# Patient Record
Sex: Female | Born: 1995 | Race: Black or African American | Hispanic: No | Marital: Single | Smoking: Never smoker
Health system: Southern US, Community
[De-identification: ages and names within clinical notes are randomized; demographics above are authoritative.]

## PROBLEM LIST (undated history)

## (undated) DIAGNOSIS — L732 Hidradenitis suppurativa: Secondary | ICD-10-CM

## (undated) DIAGNOSIS — L309 Dermatitis, unspecified: Secondary | ICD-10-CM

## (undated) DIAGNOSIS — D649 Anemia, unspecified: Secondary | ICD-10-CM

## (undated) HISTORY — DX: Anemia, unspecified: D64.9

## (undated) HISTORY — DX: Dermatitis, unspecified: L30.9

## (undated) HISTORY — DX: Hidradenitis suppurativa: L73.2

---
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
## 2016-02-19 DIAGNOSIS — Z79899 Other long term (current) drug therapy: Secondary | ICD-10-CM | POA: Diagnosis not present

## 2016-02-19 DIAGNOSIS — L700 Acne vulgaris: Secondary | ICD-10-CM | POA: Diagnosis not present

## 2016-02-19 DIAGNOSIS — L732 Hidradenitis suppurativa: Secondary | ICD-10-CM | POA: Diagnosis not present

## 2016-03-23 DIAGNOSIS — L700 Acne vulgaris: Secondary | ICD-10-CM | POA: Diagnosis not present

## 2016-03-23 DIAGNOSIS — Z79899 Other long term (current) drug therapy: Secondary | ICD-10-CM | POA: Diagnosis not present

## 2016-03-23 DIAGNOSIS — L732 Hidradenitis suppurativa: Secondary | ICD-10-CM | POA: Diagnosis not present

## 2016-04-20 DIAGNOSIS — L700 Acne vulgaris: Secondary | ICD-10-CM | POA: Diagnosis not present

## 2016-04-20 DIAGNOSIS — Z79899 Other long term (current) drug therapy: Secondary | ICD-10-CM | POA: Diagnosis not present

## 2016-04-20 DIAGNOSIS — L732 Hidradenitis suppurativa: Secondary | ICD-10-CM | POA: Diagnosis not present

## 2016-05-18 DIAGNOSIS — L732 Hidradenitis suppurativa: Secondary | ICD-10-CM | POA: Diagnosis not present

## 2016-05-18 DIAGNOSIS — L700 Acne vulgaris: Secondary | ICD-10-CM | POA: Diagnosis not present

## 2016-05-18 DIAGNOSIS — Z79899 Other long term (current) drug therapy: Secondary | ICD-10-CM | POA: Diagnosis not present

## 2017-05-24 ENCOUNTER — Ambulatory Visit (INDEPENDENT_AMBULATORY_CARE_PROVIDER_SITE_OTHER): Payer: 59 | Admitting: Family Medicine

## 2017-05-24 ENCOUNTER — Other Ambulatory Visit: Payer: Self-pay

## 2017-05-24 ENCOUNTER — Encounter: Payer: Self-pay | Admitting: Family Medicine

## 2017-05-24 VITALS — BP 122/81 | HR 71 | Temp 98.6°F | Resp 17 | Ht 64.25 in | Wt 203.8 lb

## 2017-05-24 DIAGNOSIS — E6609 Other obesity due to excess calories: Secondary | ICD-10-CM | POA: Diagnosis not present

## 2017-05-24 DIAGNOSIS — Z6834 Body mass index (BMI) 34.0-34.9, adult: Secondary | ICD-10-CM

## 2017-05-24 DIAGNOSIS — R062 Wheezing: Secondary | ICD-10-CM | POA: Diagnosis not present

## 2017-05-24 DIAGNOSIS — Z13220 Encounter for screening for lipoid disorders: Secondary | ICD-10-CM | POA: Diagnosis not present

## 2017-05-24 DIAGNOSIS — J4520 Mild intermittent asthma, uncomplicated: Secondary | ICD-10-CM | POA: Diagnosis not present

## 2017-05-24 DIAGNOSIS — Z131 Encounter for screening for diabetes mellitus: Secondary | ICD-10-CM | POA: Diagnosis not present

## 2017-05-24 DIAGNOSIS — Z0000 Encounter for general adult medical examination without abnormal findings: Secondary | ICD-10-CM | POA: Diagnosis not present

## 2017-05-24 MED ORDER — ALBUTEROL SULFATE HFA 108 (90 BASE) MCG/ACT IN AERS: 2.0000 | INHALATION_SPRAY | RESPIRATORY_TRACT | 1 refills | Status: AC | PRN

## 2017-05-24 NOTE — Patient Instructions (Addendum)
   IF you received an x-ray today, you will receive an invoice from [facility]. Please contact [facility] at [phone] with questions or concerns regarding your invoice.   IF you received labwork today, you will receive an invoice from [facility]. Please contact [facility] at [phone] with questions or concerns regarding your invoice.   Our billing staff will not be able to assist you with questions regarding bills from these companies.  You will be contacted with the lab results as soon as they are available. The fastest way to get your results is to activate your My Chart account. Instructions are located on the last page of this paperwork. If you have not heard from us regarding the results in 2 weeks, please contact this office.      Asthma Attack Prevention, Adult Although you may not be able to control the fact that you have asthma, you can take actions to prevent episodes of asthma (asthma attacks). These actions include:  Creating a written plan for managing and treating your asthma attacks (asthma action plan).  Monitoring your asthma.  Avoiding things that can irritate your airways or make your asthma symptoms worse (asthma triggers).  Taking your medicines as directed.  Acting quickly if you have signs or symptoms of an asthma attack.  What are some ways to prevent an asthma attack? Create a plan Work with your health care provider to create an asthma action plan. This plan should include:  A list of your asthma triggers and how to avoid them.  A list of symptoms that you experience during an asthma attack.  Information about when to take medicine and how much medicine to take.  Information to help you understand your peak flow measurements.  Contact information for your health care providers.  Daily actions that you can take to control asthma.  Monitor your asthma  To monitor your asthma:  Use your peak flow meter every morning and  every evening for 2-3 weeks. Record the results in a journal. A drop in your peak flow numbers on one or more days may mean that you are starting to have an asthma attack, even if you are not having symptoms.  When you have asthma symptoms, write them down in a journal.  Avoid asthma triggers  Work with your health care provider to find out what your asthma triggers are. This can be done by:  Being tested for allergies.  Keeping a journal that notes when asthma attacks occur and what may have contributed to them.  Asking your health care provider whether other medical conditions make your asthma worse.  Common asthma triggers include:  Dust.  Smoke. This includes campfire smoke and secondhand smoke from tobacco products.  Pet dander.  Trees, grasses or pollens.  Very cold, dry, or humid air.  Mold.  Foods that contain high amounts of sulfites.  Strong smells.  Engine exhaust and air pollution.  Aerosol sprays and fumes from household cleaners.  Household pests and their droppings, including dust mites and cockroaches.  Certain medicines, including NSAIDs.  Once you have determined your asthma triggers, take steps to avoid them. Depending on your triggers, you may be able to reduce the chance of an asthma attack by:  Keeping your home clean. Have someone dust and vacuum your home for you 1 or 2 times a week. If possible, have them use a high-efficiency particulate arrestance (HEPA) vacuum.  Washing your sheets weekly in hot water.  Using allergy-proof mattress covers and   casings on your bed.  Keeping pets out of your home.  Taking care of mold and water problems in your home.  Avoiding areas where people smoke.  Avoiding using strong perfumes or odor sprays.  Avoid spending a lot of time outdoors when pollen counts are high and on very windy days.  Talking with your health care provider before stopping or starting any new medicines.  Medicines Take  over-the-counter and prescription medicines only as told by your health care provider. Many asthma attacks can be prevented by carefully following your medicine schedule. Taking your medicines correctly is especially important when you cannot avoid certain asthma triggers. Even if you are doing well, do not stop taking your medicine and do not take less medicine. Act quickly If an asthma attack happens, acting quickly can decrease how severe it is and how long it lasts. Take these actions:  Pay attention to your symptoms. If you are coughing, wheezing, or having difficulty breathing, do not wait to see if your symptoms go away on their own. Follow your asthma action plan.  If you have followed your asthma action plan and your symptoms are not improving, call your health care provider or seek immediate medical care at the nearest hospital.  It is important to write down how often you need to use your fast-acting rescue inhaler. You can track how often you use an inhaler in your journal. If you are using your rescue inhaler more often, it may mean that your asthma is not under control. Adjusting your asthma treatment plan may help you to prevent future asthma attacks and help you to gain better control of your condition. How can I prevent an asthma attack when I exercise?  Exercise is a common asthma trigger. To prevent asthma attacks during exercise:  Follow advice from your health care provider about whether you should use your fast-acting inhaler before exercising. Many people with asthma experience exercise-induced bronchoconstriction (EIB). This condition often worsens during vigorous exercise in cold, humid, or dry environments. Usually, people with EIB can stay very active by using a fast-acting inhaler before exercising.  Avoid exercising outdoors in very cold or humid weather.  Avoid exercising outdoors when pollen counts are high.  Warm up and cool down when exercising.  Stop exercising  right away if asthma symptoms start.  Consider taking part in exercises that are less likely to cause asthma symptoms such as:  Indoor swimming.  Biking.  Walking.  Hiking.  Playing football.  This information is not intended to replace advice given to you by your health care provider. Make sure you discuss any questions you have with your health care provider. Document Released: 01/19/2009 Document Revised: 10/02/2015 Document Reviewed: 07/18/2015 Elsevier Interactive Patient Education  2018 [employer] Inc[employer]  

## 2017-05-24 NOTE — Progress Notes (Signed)
Chief Complaint  Patient presents with  . Annual Exam    no pap    Subjective:  Diana Blankenship is a 22 y.o. female here for a health maintenance visit.  Patient is established pt  Pt needs refill of albuterol inhaler Since the heavy pollen she has been coughing, wheezing and chest tightness She reports that she has been out of her inhaler for a few months She states that her last asthma flare was 2017 Her triggers are pollen and strong fragrances She takes an over the counter antihistamine but does not know which one she is taking    Patient Active Problem List   Diagnosis Date Noted  . Extrinsic asthma 04/14/2015  . Hidradenitis suppurativa 08/04/2012    Past Medical History:  Diagnosis Date  . Anemia   . Asthma   . Eczema   . Hidradenitis     No past surgical history on file.   Outpatient Medications Prior to Visit  Medication Sig Dispense Refill  . albuterol (PROVENTIL HFA;VENTOLIN HFA) 108 (90 Base) MCG/ACT inhaler Inhale 2 puffs into the lungs every 4 (four) hours as needed for wheezing or shortness of breath (cough, shortness of breath or wheezing.). 1 Inhaler 3  . azithromycin (ZITHROMAX) 250 MG tablet Take 2 tabs PO x 1 dose, then 1 tab PO QD x 4 days 6 tablet 0  . doxycycline (VIBRAMYCIN) 100 MG capsule Take 1 capsule (100 mg total) by mouth 2 (two) times daily. (Patient not taking: Reported on 04/14/2015) 60 capsule 0  . prednisoLONE (ORAPRED ODT) 10 MG disintegrating tablet Take 10 mg by mouth daily. Reported on 04/14/2015     No facility-administered medications prior to visit.     No Known Allergies   Family History  Problem Relation Age of Onset  . Diabetes Mother   . Hyperlipidemia Mother   . Hypertension Father      Health Habits: Dental Exam: up to date Eye Exam: up to date   Social History   Socioeconomic History  . Marital status: Single    Spouse name: Not on file  . Number of children: Not on file  . Years of education: Not on  file  . Highest education level: Not on file  Occupational History  . Not on file  Social Needs  . Financial resource strain: Not on file  . Food insecurity:    Worry: Not on file    Inability: Not on file  . Transportation needs:    Medical: Not on file    Non-medical: Not on file  Tobacco Use  . Smoking status: Never Smoker  . Smokeless tobacco: Never Used  Substance and Sexual Activity  . Alcohol use: No  . Drug use: No  . Sexual activity: Not on file  Lifestyle  . Physical activity:    Days per week: Not on file    Minutes per session: Not on file  . Stress: Not on file  Relationships  . Social connections:    Talks on phone: Not on file    Gets together: Not on file    Attends religious service: Not on file    Active member of club or organization: Not on file    Attends meetings of clubs or organizations: Not on file    Relationship status: Not on file  . Intimate partner violence:    Fear of current or ex partner: Not on file    Emotionally abused: Not on file    Physically  abused: Not on file    Forced sexual activity: Not on file  Other Topics Concern  . Not on file  Social History Narrative  . Not on file   Social History   Substance and Sexual Activity  Alcohol Use No   Social History   Tobacco Use  Smoking Status Never Smoker  Smokeless Tobacco Never Used   Social History   Substance and Sexual Activity  Drug Use No    GYN: Sexual Health Menstrual status: regular menses LMP: Patient's last menstrual period was 05/22/2017. Last pap smear: see HM section History of abnormal pap smears:  Sexually active: never Current contraception: none  Health Maintenance: See under health Maintenance activity for review of completion dates as well.  There is no immunization history on file for this patient.    Depression Screen-PHQ2/9 Depression screen [facility] 2/9 05/24/2017 04/14/2015 01/18/2015  Decreased Interest 0 0 0  Down, Depressed, Hopeless 0 0  0  PHQ - 2 Score 0 0 0    Depression Severity and Treatment Recommendations:  0-4= None  5-9= Mild / Treatment: Support, educate to call if worse; return in one month  10-14= Moderate / Treatment: Support, watchful waiting; Antidepressant or Psycotherapy  15-19= Moderately severe / Treatment: Antidepressant OR Psychotherapy  >= 20 = Major depression, severe / Antidepressant AND Psychotherapy    Review of Systems   Review of Systems  Constitutional: Negative for chills and fever.  HENT: Positive for congestion and sinus pain.   Respiratory: Positive for cough, shortness of breath and wheezing. Negative for hemoptysis and sputum production.   Cardiovascular: Negative for chest pain and palpitations.  Gastrointestinal: Negative for abdominal pain, nausea and vomiting.  Genitourinary: Negative for dysuria, frequency and urgency.     See HPI for ROS as well.    Objective:   Vitals:   05/24/17 1413  BP: 122/81  Pulse: 71  Resp: 17  Temp: 98.6 F (37 C)  TempSrc: Oral  SpO2: 95%  Weight: 203 lb 12.8 oz (92.4 kg)  Height: 5' 4.25" (1.632 m)    Body mass index is 34.71 kg/m.  Physical Exam  BP 122/81 (BP Location: Left Arm, Patient Position: Sitting, Cuff Size: Large)   Pulse 71   Temp 98.6 F (37 C) (Oral)   Resp 17   Ht 5' 4.25" (1.632 m)   Wt 203 lb 12.8 oz (92.4 kg)   LMP 05/22/2017   SpO2 95%   BMI 34.71 kg/m   General Appearance:    Alert, cooperative, no distress, appears stated age  Head:    Normocephalic, without obvious abnormality, atraumatic  Eyes:    PERRL, conjunctiva/corneas clear, EOM's intact, fundi    benign, both eyes  Ears:    Normal TM's and external ear canals, both ears  Nose:   Nares normal, septum midline, mucosa normal, no drainage    or sinus tenderness  Throat:   Lips, mucosa, and tongue normal; teeth and gums normal  Neck:   Supple, symmetrical, trachea midline, no adenopathy;    thyroid:  no enlargement/tenderness/nodules; no  carotid   bruit or JVD  Back:     Symmetric, no curvature, ROM normal, no CVA tenderness Breast exam: chaperone present, scars from previous procedures for lancing of boils and hydradenitis  Scar tissues noted in the axillary areas bilaterally  Lungs:     Clear to auscultation bilaterally, respirations unlabored  Chest Wall:    No tenderness or deformity   Heart:    Regular  rate and rhythm, S1 and S2 normal, no murmur, rub   or gallop  Abdomen:     Soft, non-tender, bowel sounds active all four quadrants,    no masses, no organomegaly  Extremities:   Extremities normal, atraumatic, no cyanosis or edema  Pulses:   2+ and symmetric all extremities  Skin:   Skin color, texture, turgor normal, no rashes or lesions  Lymph nodes:   Cervical, supraclavicular, and axillary nodes normal  Neurologic:   CNII-XII intact, normal strength, sensation and reflexes    throughout      Assessment/Plan:   Patient was seen for a health maintenance exam.  Counseled the patient on health maintenance issues. Reviewed her health mainteance schedule and ordered appropriate tests (see orders.) Counseled on regular exercise and weight management. Recommend regular eye exams and dental cleaning.   The following issues were addressed today for health maintenance:   Tykisha was seen today for annual exam.  Diagnoses and all orders for this visit:  Encounter for health maintenance examination in adult- age appropriate screenings reviewed  Pap to be done by Gynecology  Mild intermittent asthma without complication- spirometry ordered and reviewed -     Care order/instruction  Screening for diabetes mellitus -     Hemoglobin A1c  Screening, lipid -     Lipid panel  Class 1 obesity due to excess calories without serious comorbidity with body mass index (BMI) of 34.0 to 34.9 in adult- discussed exercise and weight loss  Extrinsic asthma, mild intermittent, uncomplicated- refilled asthma medications -      albuterol (PROVENTIL HFA;VENTOLIN HFA) 108 (90 Base) MCG/ACT inhaler; Inhale 2 puffs into the lungs every 4 (four) hours as needed for wheezing or shortness of breath (cough, shortness of breath or wheezing.).  Wheezing -     albuterol (PROVENTIL HFA;VENTOLIN HFA) 108 (90 Base) MCG/ACT inhaler; Inhale 2 puffs into the lungs every 4 (four) hours as needed for wheezing or shortness of breath (cough, shortness of breath or wheezing.).    No follow-ups on file.    Body mass index is 34.71 kg/m.:  Discussed the patient's BMI with patient. The BMI body mass index is 34.71 kg/m.     No future appointments.  Patient Instructions       IF you received an x-ray today, [facility]. Please [facility] at [phone] with questions or concerns regarding your invoice.   IF you received labwork today, you will receive an invoice from [facility]. Please contact [facility] at [phone] with questions or concerns regarding your invoice.   Our billing staff will not be able to assist you with questions regarding bills from these companies.  You will be contacted with the lab results as soon as they are available. The fastest way to get your results is to activate your My Chart account. Instructions are located on the last page of this paperwork. If you have not heard from us regarding the results in 2 weeks, please contact this office.            Asthma Attack Prevention, Adult Although you may not be able to control the fact that you have asthma, you can take actions to prevent episodes of asthma (asthma attacks). These actions include:  Creating a written plan for managing and treating your asthma attacks (asthma action plan).  Monitoring your asthma.  Avoiding things that can irritate your airways or make your asthma symptoms worse (asthma triggers).  Taking your  medicines as directed.  Acting quickly if you have signs or  symptoms of an asthma attack.  What are some ways to prevent an asthma attack? Create a plan Work with your health care provider to create an asthma action plan. This plan should include:  A list of your asthma triggers and how to avoid them.  A list of symptoms that you experience during an asthma attack.  Information about when to take medicine and how much medicine to take.  Information to help you understand your peak flow measurements.  Contact information for your health care providers.  Daily actions that you can take to control asthma.  Monitor your asthma  To monitor your asthma:  Use your peak flow meter every morning and every evening for 2-3 weeks. Record the results in a journal. A drop in your peak flow numbers on one or more days may mean that you are starting to have an asthma attack, even if you are not having symptoms.  When you have asthma symptoms, write them down in a journal.  Avoid asthma triggers  Work with your health care provider to find out what your asthma triggers are. This can be done by:  Being tested for allergies.  Keeping a journal that notes when asthma attacks occur and what may have contributed to them.  Asking your health care provider whether other medical conditions make your asthma worse.  Common asthma triggers include:  Dust.  Smoke. This includes campfire smoke and secondhand smoke from tobacco products.  Pet dander.  Trees, grasses or pollens.  Very cold, dry, or humid air.  Mold.  Foods that contain high amounts of sulfites.  Strong smells.  Engine exhaust and air pollution.  Aerosol sprays and fumes from household cleaners.  Household pests and their droppings, including dust mites and cockroaches.  Certain medicines, including NSAIDs.  Once you have determined your asthma triggers, take steps to avoid them. Depending on your triggers, you may be able to reduce the chance of an asthma attack by:  Keeping  your home clean. Have someone dust and vacuum your home for you 1 or 2 times a week. If possible, have them use a high-efficiency particulate arrestance (HEPA) vacuum.  Washing your sheets weekly in hot water.  Using allergy-proof mattress covers and casings on your bed.  Keeping pets out of your home.  Taking care of mold and water problems in your home.  Avoiding areas where people smoke.  Avoiding using strong perfumes or odor sprays.  Avoid spending a lot of time outdoors when pollen counts are high and on very windy days.  Talking with your health care provider before stopping or starting any new medicines.  Medicines Take over-the-counter and prescription medicines only as told by your health care provider. Many asthma attacks can be prevented by carefully following your medicine schedule. Taking your medicines correctly is especially important when you cannot avoid certain asthma triggers. Even if you are doing well, do not stop taking your medicine and do not take less medicine. Act quickly If an asthma attack happens, acting quickly can decrease how severe it is and how long it lasts. Take these actions:  Pay attention to your symptoms. If you are coughing, wheezing, or having difficulty breathing, do not wait to see if your symptoms go away on their own. Follow your asthma action plan.  If you have followed your asthma action plan and your symptoms are not improving, call your health care provider or seek  immediate medical care at the nearest hospital.  It is important to write down how often you need to use your fast-acting rescue inhaler. You can track how often you use an inhaler in your journal. If you are using your rescue inhaler more often, it may mean that your asthma is not under control. Adjusting your asthma treatment plan may help you to prevent future asthma attacks and help you to gain better control of your condition. How can I prevent an asthma attack when I  exercise?  Exercise is a common asthma trigger. To prevent asthma attacks during exercise:  Follow advice from your health care provider about whether you should use your fast-acting inhaler before exercising. Many people with asthma experience exercise-induced bronchoconstriction (EIB). This condition often worsens during vigorous exercise in cold, humid, or dry environments. Usually, people with EIB can stay very active by using a fast-acting inhaler before exercising.  Avoid exercising outdoors in very cold or humid weather.  Avoid exercising outdoors when pollen counts are high.  Warm up and cool down when exercising.  Stop exercising right away if asthma symptoms start.  Consider taking part in exercises that are less likely to cause asthma symptoms such as:  Indoor swimming.  Biking.  Walking.  Hiking.  Playing football.  This information is not intended to replace advice given to you by your health care provider. Make sure you discuss any questions you have with your health care provider. Document Released: 01/19/2009 Document Revised: 10/02/2015 Document Reviewed: 07/18/2015 Elsevier Interactive Patient Education  [employer].

## 2017-05-25 LAB — HEMOGLOBIN A1C
ESTIMATED AVERAGE GLUCOSE: 126 mg/dL
HEMOGLOBIN A1C: 6 % — AB (ref 4.8–5.6)

## 2017-05-25 LAB — LIPID PANEL
CHOLESTEROL TOTAL: 149 mg/dL (ref 100–199)
Chol/HDL Ratio: 3.5 ratio (ref 0.0–4.4)
HDL: 42 mg/dL (ref >39)
LDL Calculated: 98 mg/dL (ref 0–99)
TRIGLYCERIDES: 47 mg/dL (ref 0–149)
VLDL Cholesterol Cal: 9 mg/dL (ref 5–40)

## 2017-05-26 ENCOUNTER — Encounter: Payer: Self-pay | Admitting: Family Medicine

## 2017-06-08 IMAGING — CR DG ANKLE COMPLETE 3+V*R*
4 series · 4 of 4 positions shown · non-contrast
Comparison: None.

CLINICAL DATA: Right ankle pain status post fall.

EXAM:
RIGHT ANKLE - COMPLETE 3+ VIEW

[AP]
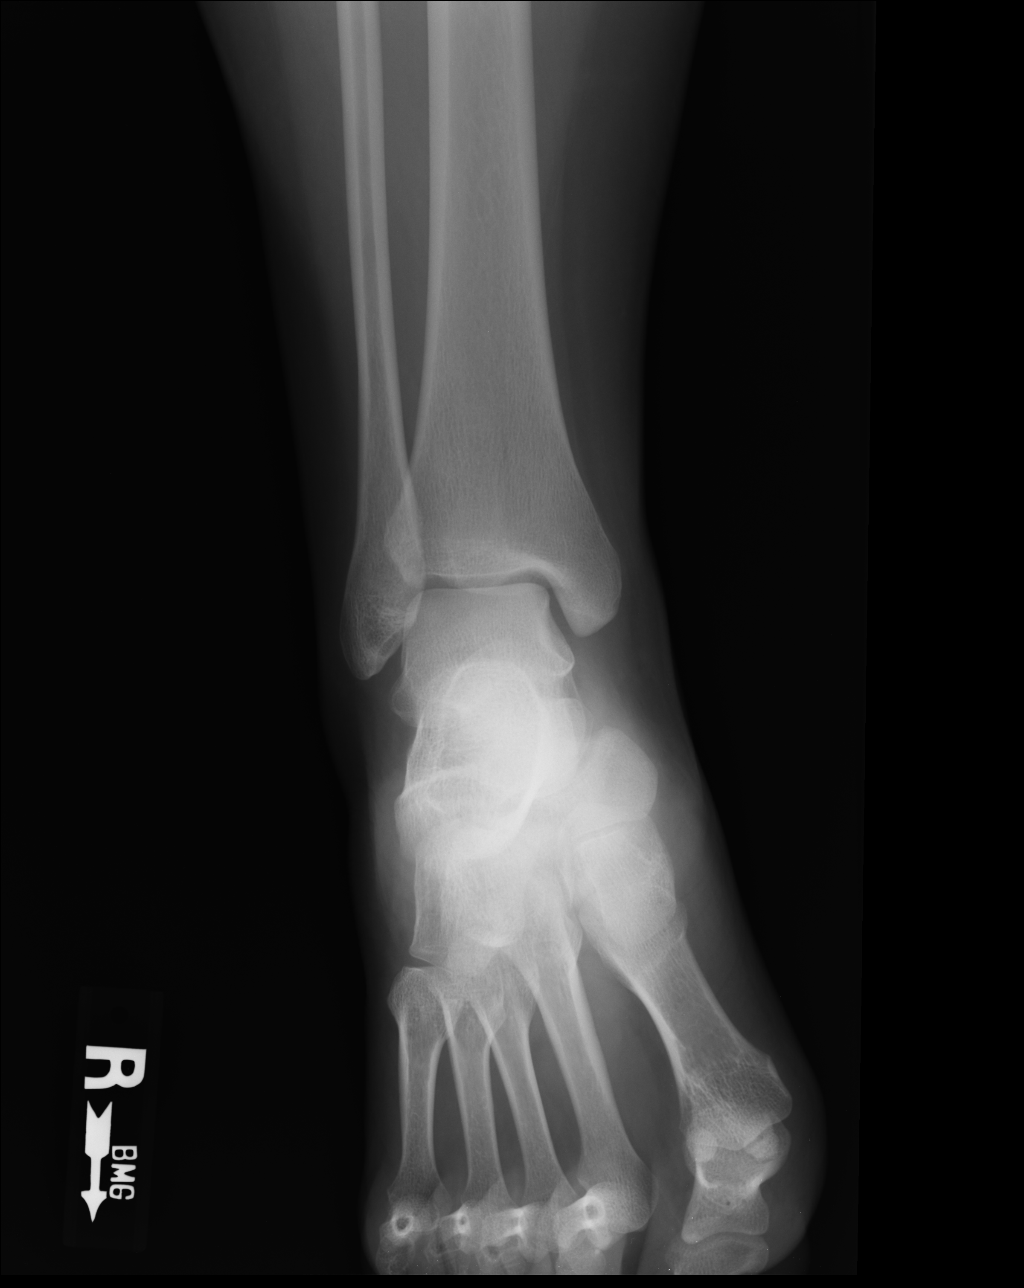

[ap obl int rot]
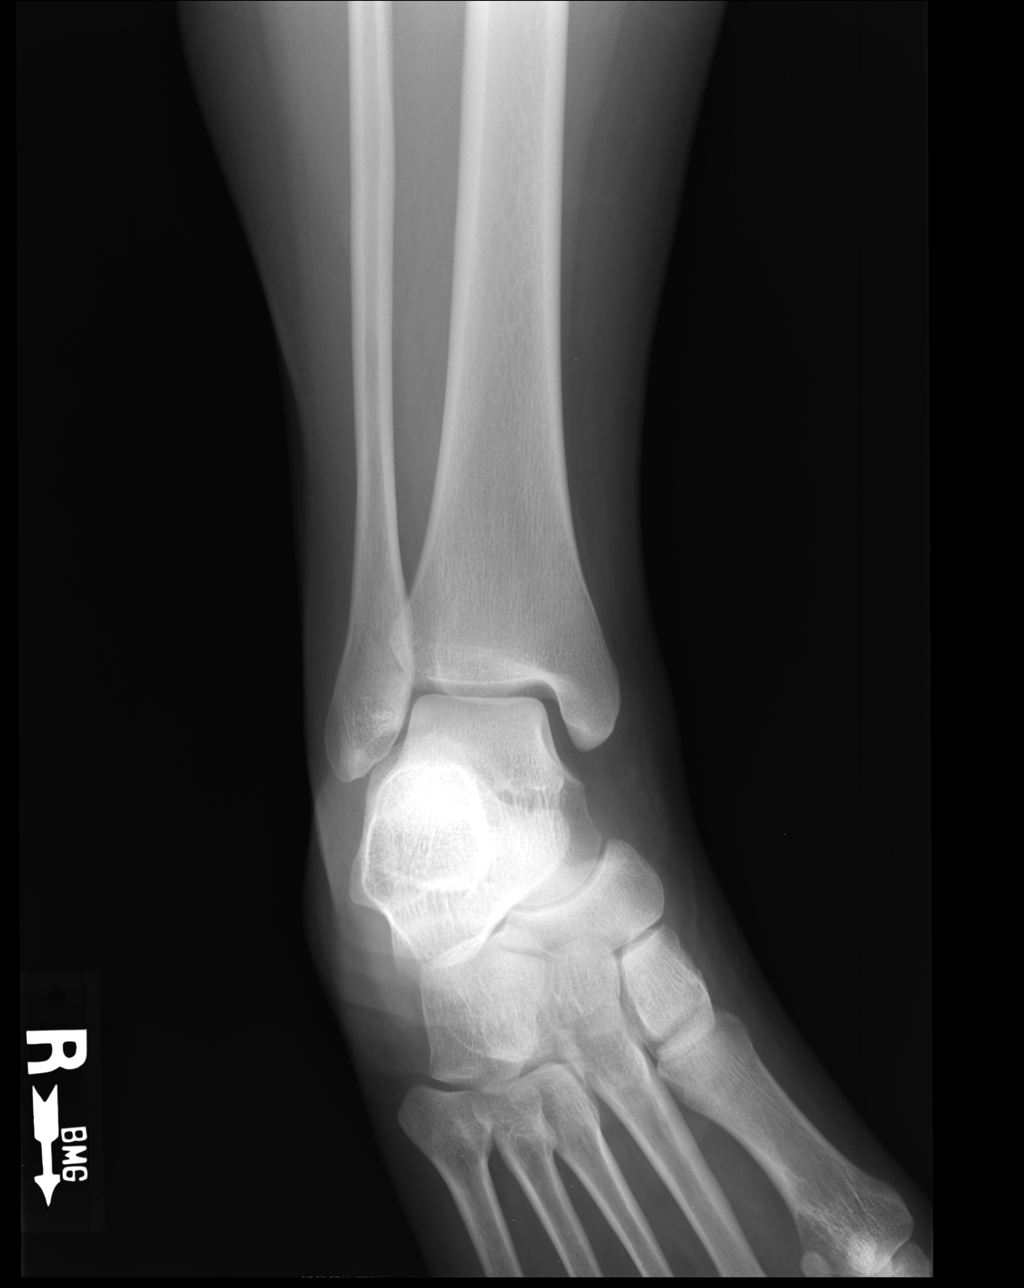

[medial obl]
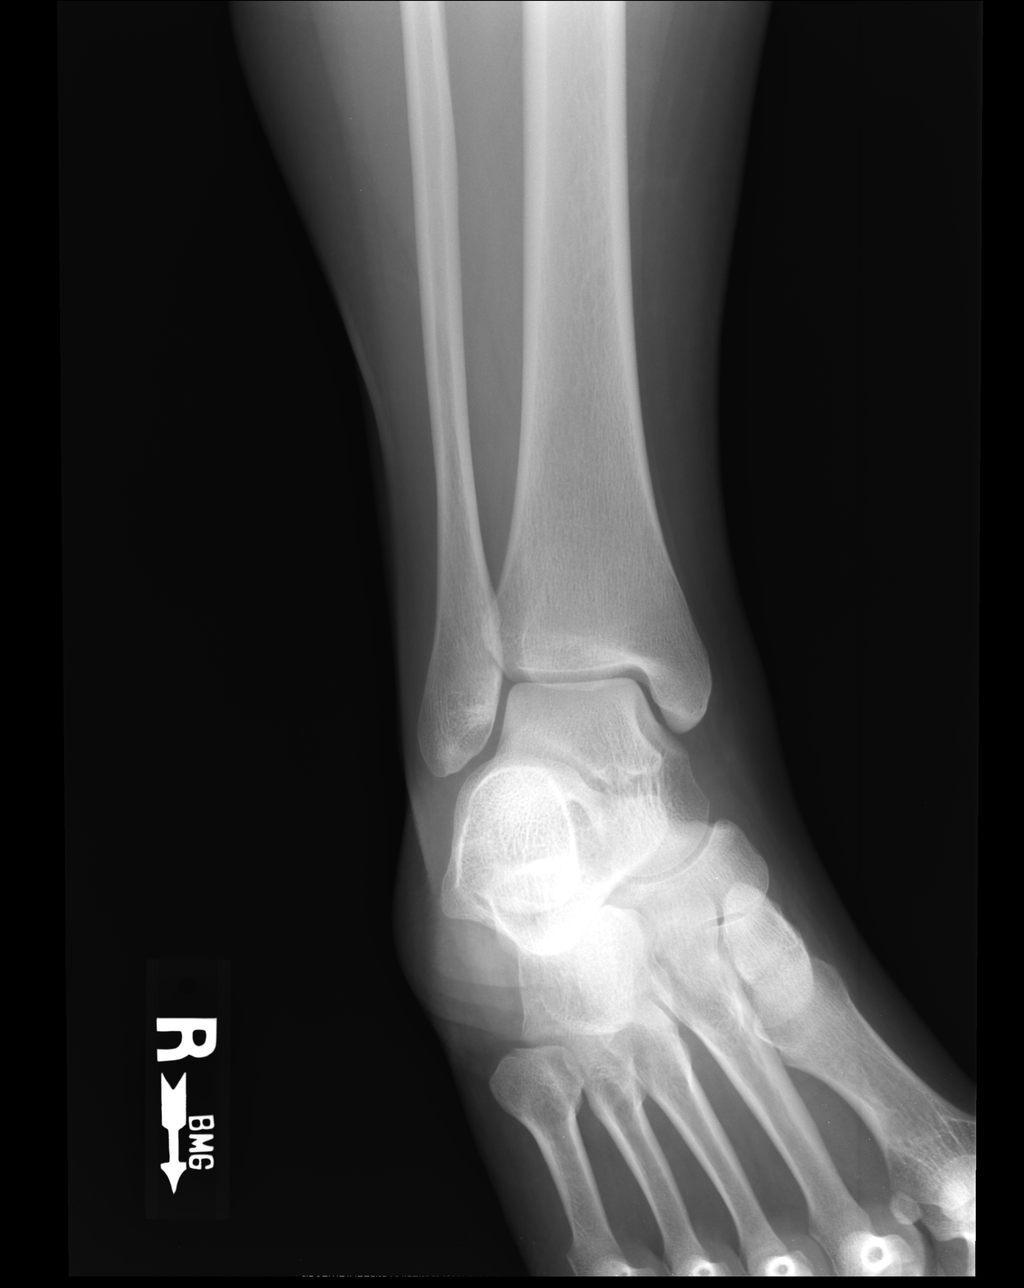

[lateral]
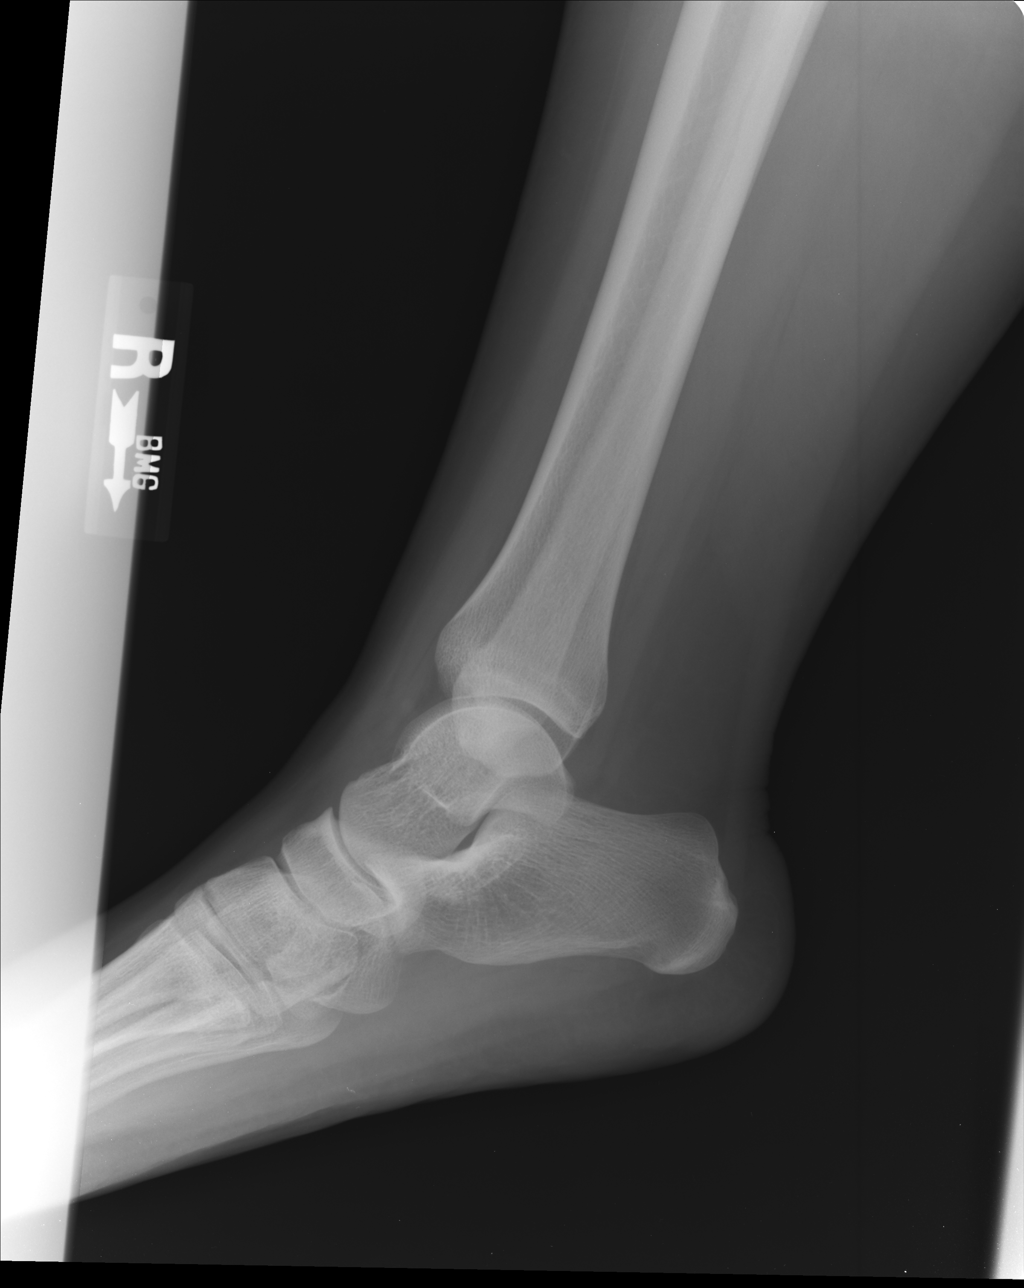

[4 of 4 positions shown; findings below may reference images not displayed]

FINDINGS: There is no evidence of fracture, dislocation, or joint effusion.
There is no evidence of arthropathy or other focal bone abnormality.
Soft tissues are unremarkable.
IMPRESSION: Negative.

## 2019-06-26 ENCOUNTER — Ambulatory Visit: Payer: 59 | Admitting: Registered Nurse

## 2019-06-26 ENCOUNTER — Other Ambulatory Visit: Payer: Self-pay

## 2019-06-26 ENCOUNTER — Encounter: Payer: Self-pay | Admitting: Registered Nurse

## 2019-06-26 VITALS — BP 124/83 | HR 93 | Temp 98.0°F | Resp 16 | Ht 64.0 in | Wt 213.2 lb

## 2019-06-26 DIAGNOSIS — L732 Hidradenitis suppurativa: Secondary | ICD-10-CM | POA: Diagnosis not present

## 2019-06-26 DIAGNOSIS — Z13220 Encounter for screening for lipoid disorders: Secondary | ICD-10-CM | POA: Diagnosis not present

## 2019-06-26 DIAGNOSIS — Z1329 Encounter for screening for other suspected endocrine disorder: Secondary | ICD-10-CM

## 2019-06-26 DIAGNOSIS — R6881 Early satiety: Secondary | ICD-10-CM

## 2019-06-26 DIAGNOSIS — Z13228 Encounter for screening for other metabolic disorders: Secondary | ICD-10-CM

## 2019-06-26 DIAGNOSIS — Z130 Encounter for screening for diseases of the blood and blood-forming organs and certain disorders involving the immune mechanism: Secondary | ICD-10-CM

## 2019-06-26 MED ORDER — DOXYCYCLINE HYCLATE 100 MG PO TABS: 100.0000 mg | ORAL_TABLET | Freq: Two times a day (BID) | ORAL | 0 refills | Status: AC

## 2019-06-26 NOTE — Patient Instructions (Signed)
° ° ° °  If you have lab work done today you will be contacted with your lab results within the next 2 weeks.  If you have not heard from us then please contact us. The fastest way to get your results is to register for My Chart. ° ° °IF you received an x-ray today, you will receive an invoice from [facility]. Please contact [facility] at [phone] with questions or concerns regarding your invoice.  ° °IF you received labwork today, you will receive an invoice from [facility]. Please contact [facility] at [phone] with questions or concerns regarding your invoice.  ° °Our billing staff will not be able to assist you with questions regarding bills from these companies. ° °You will be contacted with the lab results as soon as they are available. The fastest way to get your results is to activate your My Chart account. Instructions are located on the last page of this paperwork. If you have not heard from us regarding the results in 2 weeks, please contact this office. °  ° ° ° °

## 2019-06-26 NOTE — Progress Notes (Signed)
Established Patient Office Visit  Subjective:  Patient ID: Diana Blankenship, female    DOB: Apr 11, 1995  Age: 24 y.o. MRN: [id number]  CC:  Chief Complaint  Patient presents with  . Follow-up    Patient states she hasnt been here in over a year so she would like to get labs and also to discuss her appetite     HPI Diana Blankenship presents for decreased appetite  Ongoing for around a few months. Early satiety, some nausea. No vomiting, diarrhea, constipation, shortness of breath, chest pain, headache, lightheadedness, dizziness.  Last labs shows prediabetes and anemia. Has recently started iron supplement again Seldom etoh use. No drug use. Only using albuterol  Hx of hidradenitis suppurativa - recently having a flair that has been waxing and waning.   Past Medical History:  Diagnosis Date  . Anemia   . Asthma   . Eczema   . Hidradenitis     History reviewed. No pertinent surgical history.  Family History  Problem Relation Age of Onset  . Diabetes Mother   . Hyperlipidemia Mother   . Hypertension Father     Social History   Socioeconomic History  . Marital status: Single    Spouse name: Not on file  . Number of children: Not on file  . Years of education: Not on file  . Highest education level: Not on file  Occupational History  . Not on file  Tobacco Use  . Smoking status: Never Smoker  . Smokeless tobacco: Never Used  Substance and Sexual Activity  . Alcohol use: No  . Drug use: No  . Sexual activity: Not on file  Other Topics Concern  . Not on file  Social History Narrative  . Not on file   Social Determinants of Health   Financial Resource Strain:   . Difficulty of Paying Living Expenses:   Food Insecurity:   . Worried About Charity fundraiser in the Last Year:   . Arboriculturist in the Last Year:   Transportation Needs:   . Film/video editor (Medical):   Marland Kitchen Lack of Transportation (Non-Medical):   Physical Activity:   . Days of  Exercise per Week:   . Minutes of Exercise per Session:   Stress:   . Feeling of Stress :   Social Connections:   . Frequency of Communication with Friends and Family:   . Frequency of Social Gatherings with Friends and Family:   . Attends Religious Services:   . Active Member of Clubs or Organizations:   . Attends Archivist Meetings:   Marland Kitchen Marital Status:   Intimate Partner Violence:   . Fear of Current or Ex-Partner:   . Emotionally Abused:   Marland Kitchen Physically Abused:   . Sexually Abused:     Outpatient Medications Prior to Visit  Medication Sig Dispense Refill  . albuterol (PROVENTIL HFA;VENTOLIN HFA) 108 (90 Base) MCG/ACT inhaler Inhale 2 puffs into the lungs every 4 (four) hours as needed for wheezing or shortness of breath (cough, shortness of breath or wheezing.). (Patient not taking: Reported on 06/26/2019) 1 Inhaler 1   No facility-administered medications prior to visit.    No Known Allergies  ROS Review of Systems  Constitutional: Positive for appetite change. Negative for activity change, chills, diaphoresis, fatigue, fever and unexpected weight change.  HENT: Negative.   Eyes: Negative.   Respiratory: Negative.   Cardiovascular: Negative.   Gastrointestinal: Negative.   Endocrine: Negative.   Genitourinary:  Negative.   Musculoskeletal: Negative.   Skin: Positive for rash. Negative for color change, pallor and wound.  Allergic/Immunologic: Negative.   Neurological: Negative.   Hematological: Negative.   Psychiatric/Behavioral: Negative.       Objective:    Physical Exam  Constitutional: She appears well-developed and well-nourished. No distress.  Cardiovascular: Normal rate, regular rhythm and normal heart sounds.  Abdominal: Soft. Bowel sounds are normal. She exhibits no distension and no mass. There is no abdominal tenderness. There is no rebound and no guarding.  Skin: Skin is warm and dry. No rash noted. She is not diaphoretic. No erythema. No  pallor.  Psychiatric: She has a normal mood and affect. Her behavior is normal. Judgment and thought content normal.  Nursing note and vitals reviewed.   BP 124/83   Pulse 93   Temp 98 F (36.7 C) (Temporal)   Resp 16   Ht '5\' 4"'$  (1.626 m)   Wt 213 lb 3.2 oz (96.7 kg)   LMP 05/30/2019   SpO2 100%   BMI 36.60 kg/m  Wt Readings from Last 3 Encounters:  06/26/19 213 lb 3.2 oz (96.7 kg)  05/24/17 203 lb 12.8 oz (92.4 kg)  04/14/15 200 lb 3.2 oz (90.8 kg) (98 %, Z= 1.97)*   * Growth percentiles are based on CDC (Girls, 2-20 Years) data.     Health Maintenance Due  Topic Date Due  . HIV Screening  Never done    There are no preventive care reminders to display for this patient.  No results found for: TSH No results found for: WBC, HGB, HCT, MCV, PLT No results found for: NA, K, CHLORIDE, CO2, GLUCOSE, BUN, CREATININE, BILITOT, ALKPHOS, AST, ALT, PROT, ALBUMIN, CALCIUM, ANIONGAP, EGFR, GFR Lab Results  Component Value Date   CHOL 149 05/24/2017   Lab Results  Component Value Date   HDL 42 05/24/2017   Lab Results  Component Value Date   LDLCALC 98 05/24/2017   Lab Results  Component Value Date   TRIG 47 05/24/2017   Lab Results  Component Value Date   CHOLHDL 3.5 05/24/2017   Lab Results  Component Value Date   HGBA1C 6.0 (H) 05/24/2017      Assessment & Plan:   Problem List Items Addressed This Visit    None    Visit Diagnoses    Screening for endocrine, metabolic and immunity disorder    -  Primary   Relevant Orders   TSH   CBC with Differential   Comprehensive metabolic panel   Hemoglobin A1c   Lipid screening       Relevant Orders   Lipid panel   Early satiety       Relevant Orders   Lipase      No orders of the defined types were placed in this encounter.   Follow-up: No follow-ups on file.   PLAN  Labs collected  zofran for nausea  Doxycycline '100mg'$  PO bid for HS  Will follow up on labs. If no clear etiology, will consider  mirtazapine to stimulate appetite, however, I am more interested in finding the cause for these symptoms in a young and otherwise health patient.  Patient encouraged to call clinic with any questions, comments, or concerns.  Maximiano Coss, NP

## 2019-06-27 LAB — COMPREHENSIVE METABOLIC PANEL
ALT: 8 IU/L (ref 0–32)
AST: 14 IU/L (ref 0–40)
Albumin/Globulin Ratio: 0.9 — ABNORMAL LOW (ref 1.2–2.2)
Albumin: 4.2 g/dL (ref 3.9–5.0)
Alkaline Phosphatase: 76 IU/L (ref 39–117)
BUN/Creatinine Ratio: 9 (ref 9–23)
BUN: 7 mg/dL (ref 6–20)
Bilirubin Total: 0.3 mg/dL (ref 0.0–1.2)
CO2: 24 mmol/L (ref 20–29)
Calcium: 9.5 mg/dL (ref 8.7–10.2)
Chloride: 101 mmol/L (ref 96–106)
Creatinine, Ser: 0.81 mg/dL (ref 0.57–1.00)
GFR calc Af Amer: 119 mL/min/{1.73_m2} (ref >59)
GFR calc non Af Amer: 103 mL/min/{1.73_m2} (ref >59)
Globulin, Total: 4.6 g/dL — ABNORMAL HIGH (ref 1.5–4.5)
Glucose: 106 mg/dL — ABNORMAL HIGH (ref 65–99)
Potassium: 4.6 mmol/L (ref 3.5–5.2)
Sodium: 136 mmol/L (ref 134–144)
Total Protein: 8.8 g/dL — ABNORMAL HIGH (ref 6.0–8.5)

## 2019-06-27 LAB — CBC WITH DIFFERENTIAL/PLATELET
Basophils Absolute: 0 10*3/uL (ref 0.0–0.2)
Basos: 1 %
EOS (ABSOLUTE): 0.1 10*3/uL (ref 0.0–0.4)
Eos: 1 %
Hematocrit: 37.1 % (ref 34.0–46.6)
Hemoglobin: 11.3 g/dL (ref 11.1–15.9)
Immature Grans (Abs): 0 10*3/uL (ref 0.0–0.1)
Immature Granulocytes: 0 %
Lymphocytes Absolute: 1.7 10*3/uL (ref 0.7–3.1)
Lymphs: 28 %
MCH: 22.4 pg — ABNORMAL LOW (ref 26.6–33.0)
MCHC: 30.5 g/dL — ABNORMAL LOW (ref 31.5–35.7)
MCV: 74 fL — ABNORMAL LOW (ref 79–97)
Monocytes Absolute: 0.5 10*3/uL (ref 0.1–0.9)
Monocytes: 9 %
Neutrophils Absolute: 3.7 10*3/uL (ref 1.4–7.0)
Neutrophils: 61 %
Platelets: 329 10*3/uL (ref 150–450)
RBC: 5.04 x10E6/uL (ref 3.77–5.28)
RDW: 15 % (ref 11.7–15.4)
WBC: 6 10*3/uL (ref 3.4–10.8)

## 2019-06-27 LAB — HEMOGLOBIN A1C
Est. average glucose Bld gHb Est-mCnc: 128 mg/dL
Hgb A1c MFr Bld: 6.1 % — ABNORMAL HIGH (ref 4.8–5.6)

## 2019-06-27 LAB — TSH: TSH: 1.54 u[IU]/mL (ref 0.450–4.500)

## 2019-06-27 LAB — LIPASE: Lipase: 30 U/L (ref 14–72)

## 2019-06-27 LAB — LIPID PANEL
Chol/HDL Ratio: 4.1 ratio (ref 0.0–4.4)
Cholesterol, Total: 147 mg/dL (ref 100–199)
HDL: 36 mg/dL — ABNORMAL LOW (ref >39)
LDL Chol Calc (NIH): 101 mg/dL — ABNORMAL HIGH (ref 0–99)
Triglycerides: 48 mg/dL (ref 0–149)
VLDL Cholesterol Cal: 10 mg/dL (ref 5–40)

## 2019-07-04 ENCOUNTER — Encounter: Payer: Self-pay | Admitting: Registered Nurse

## 2021-10-28 ENCOUNTER — Ambulatory Visit (HOSPITAL_COMMUNITY)
Admission: EM | Admit: 2021-10-28 | Discharge: 2021-10-28 | Disposition: A | Discharge status: Home / Self Care | Payer: 59 | Attending: Family Medicine | Admitting: Family Medicine

## 2021-10-28 ENCOUNTER — Encounter (HOSPITAL_COMMUNITY): Payer: Self-pay

## 2021-10-28 DIAGNOSIS — R110 Nausea: Secondary | ICD-10-CM | POA: Insufficient documentation

## 2021-10-28 DIAGNOSIS — Z1152 Encounter for screening for COVID-19: Secondary | ICD-10-CM | POA: Insufficient documentation

## 2021-10-28 DIAGNOSIS — Z862 Personal history of diseases of the blood and blood-forming organs and certain disorders involving the immune mechanism: Secondary | ICD-10-CM | POA: Diagnosis not present

## 2021-10-28 DIAGNOSIS — R42 Dizziness and giddiness: Secondary | ICD-10-CM | POA: Diagnosis present

## 2021-10-28 LAB — POC URINE PREG, ED: Preg Test, Ur: NEGATIVE

## 2021-10-28 LAB — CBC WITH DIFFERENTIAL/PLATELET
Abs Immature Granulocytes: 0.03 10*3/uL (ref 0.00–0.07)
Basophils Absolute: 0.1 10*3/uL (ref 0.0–0.1)
Basophils Relative: 1 %
Eosinophils Absolute: 0 10*3/uL (ref 0.0–0.5)
Eosinophils Relative: 0 %
HCT: 37 % (ref 36.0–46.0)
Hemoglobin: 11.9 g/dL — ABNORMAL LOW (ref 12.0–15.0)
Immature Granulocytes: 0 %
Lymphocytes Relative: 24 %
Lymphs Abs: 2.4 10*3/uL (ref 0.7–4.0)
MCH: 25.1 pg — ABNORMAL LOW (ref 26.0–34.0)
MCHC: 32.2 g/dL (ref 30.0–36.0)
MCV: 77.9 fL — ABNORMAL LOW (ref 80.0–100.0)
Monocytes Absolute: 0.4 10*3/uL (ref 0.1–1.0)
Monocytes Relative: 4 %
Neutro Abs: 7 10*3/uL (ref 1.7–7.7)
Neutrophils Relative %: 71 %
Platelets: 302 10*3/uL (ref 150–400)
RBC: 4.75 MIL/uL (ref 3.87–5.11)
RDW: 13.6 % (ref 11.5–15.5)
WBC: 9.9 10*3/uL (ref 4.0–10.5)
nRBC: 0 % (ref 0.0–0.2)

## 2021-10-28 LAB — SARS CORONAVIRUS 2 BY RT PCR: SARS Coronavirus 2 by RT PCR: NEGATIVE

## 2021-10-28 LAB — COMPREHENSIVE METABOLIC PANEL
ALT: 13 U/L (ref 0–44)
AST: 15 U/L (ref 15–41)
Albumin: 3.7 g/dL (ref 3.5–5.0)
Alkaline Phosphatase: 60 U/L (ref 38–126)
Anion gap: 6 (ref 5–15)
BUN: 7 mg/dL (ref 6–20)
CO2: 24 mmol/L (ref 22–32)
Calcium: 9.4 mg/dL (ref 8.9–10.3)
Chloride: 103 mmol/L (ref 98–111)
Creatinine, Ser: 0.78 mg/dL (ref 0.44–1.00)
GFR, Estimated: >60 mL/min (ref >60)
Glucose, Bld: 177 mg/dL — ABNORMAL HIGH (ref 70–99)
Potassium: 4.4 mmol/L (ref 3.5–5.1)
Sodium: 133 mmol/L — ABNORMAL LOW (ref 135–145)
Total Bilirubin: 0.4 mg/dL (ref 0.3–1.2)
Total Protein: 9.1 g/dL — ABNORMAL HIGH (ref 6.5–8.1)

## 2021-10-28 LAB — POCT URINALYSIS DIPSTICK, ED / UC
Bilirubin Urine: NEGATIVE
Glucose, UA: NEGATIVE mg/dL
Ketones, ur: NEGATIVE mg/dL
Nitrite: NEGATIVE
Protein, ur: NEGATIVE mg/dL
Specific Gravity, Urine: <=1.005 (ref 1.005–1.030)
Urobilinogen, UA: 0.2 mg/dL (ref 0.0–1.0)
pH: 6 (ref 5.0–8.0)

## 2021-10-28 LAB — CBG MONITORING, ED: Glucose-Capillary: 195 mg/dL — ABNORMAL HIGH (ref 70–99)

## 2021-10-28 NOTE — Discharge Instructions (Addendum)
Your blood work was normal. Your hemoglobin today was 11.9, this is improved from your previous result. I recommend continue your iron supplement.  We will call you if your covid test returns positive. Please drink lots of water over the next few days.  I recommend following up with your primary care provider regarding your symptoms and your prediabetes.

## 2021-10-28 NOTE — ED Triage Notes (Signed)
Onset 2-week history of nausea and dizziness. Pt thinks her iron is low and would like to have this check today.

## 2021-10-28 NOTE — ED Provider Notes (Signed)
[facility]    CSN: [id number] Arrival date & time: 10/28/21  1011     History   Chief Complaint Chief Complaint  Patient presents with   Nausea   Weakness    HPI Diana Blankenship is a 26 y.o. female.  Presents with 2 week history of multiple symptoms. Reports nausea, dizziness when standing, shortness of breath, headache No abdominal pain, vomiting or diarrhea  Denies URI symptoms, no fever. Denies urinary symptoms. No known sick contacts  Reports history of anemia and would like to have this checked today. 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 No pertinent surgical history.  OB History   No obstetric history on file.      Home Medications    Prior to Admission medications   Medication Sig Start Date End Date Taking? Authorizing Provider  albuterol (PROVENTIL HFA;VENTOLIN HFA) 108 (90 Base) MCG/ACT inhaler Inhale 2 puffs into the lungs every 4 (four) hours as needed for wheezing or shortness of breath (cough, shortness of breath or wheezing.). Patient not taking: Reported on 06/26/2019 05/24/17   Doristine Bosworth, MD  doxycycline (VIBRA-TABS) 100 MG tablet Take 1 tablet (100 mg total) by mouth 2 (two) times daily. 06/26/19   Janeece Agee, NP    Family History Family History  Problem Relation Age of Onset   Diabetes Mother    Hyperlipidemia Mother    Hypertension Father     Social History Social History   Tobacco Use   Smoking status: Never   Smokeless tobacco: Never  Substance Use Topics   Alcohol use: No   Drug use: No     Allergies   Patient has no known allergies.   Review of  Systems Review of Systems Per HPI  Physical Exam Triage Vital Signs ED Triage Vitals [10/28/21 1108]  Enc Vitals Group     BP (!) 151/90     Pulse Rate (!) 102     Resp 18     Temp 98.2 F (36.8 C)     Temp Source Oral     SpO2 95 %     Weight      Height      Head Circumference      Peak Flow      Pain Score      Pain Loc      Pain Edu?      Excl. in GC?    No data found.  Updated Vital Signs BP (!) 151/90 (BP Location: Left Arm)   Pulse (!) 102   Temp 98.2 F (36.8 C) (Oral)   Resp 18   SpO2 95%   Physical Exam Vitals and nursing note reviewed.  Constitutional:      General: She is not in acute distress.    Appearance: Normal appearance. She is not ill-appearing.  HENT:     Mouth/Throat:     Pharynx: Oropharynx is clear. No posterior oropharyngeal erythema.  Eyes:     Extraocular Movements: Extraocular movements intact.     Conjunctiva/sclera: Conjunctivae normal.     Pupils: Pupils are  equal, round, and reactive to light.  Cardiovascular:     Rate and Rhythm: Normal rate and regular rhythm.     Pulses: Normal pulses.     Heart sounds: Normal heart sounds.  Pulmonary:     Effort: Pulmonary effort is normal. No respiratory distress.     Breath sounds: Normal breath sounds. No wheezing, rhonchi or rales.  Abdominal:     Palpations: Abdomen is soft.     Tenderness: There is no abdominal tenderness. There is no guarding.  Skin:    General: Skin is warm and dry.  Neurological:     General: No focal deficit present.     Mental Status: She is alert and oriented to person, place, and time.     UC Treatments / Results  Labs (all labs ordered are listed, but only abnormal results are displayed) Labs Reviewed  CBC WITH DIFFERENTIAL/PLATELET - Abnormal; Notable for the following components:      Result Value   Hemoglobin 11.9 (*)    MCV 77.9 (*)    MCH 25.1 (*)    All other components within normal limits  COMPREHENSIVE METABOLIC PANEL - Abnormal;  Notable for the following components:   Sodium 133 (*)    Glucose, Bld 177 (*)    Total Protein 9.1 (*)    All other components within normal limits  POCT URINALYSIS DIPSTICK, ED / UC - Abnormal; Notable for the following components:   Hgb urine dipstick TRACE (*)    Leukocytes,Ua TRACE (*)    All other components within normal limits  CBG MONITORING, ED - Abnormal; Notable for the following components:   Glucose-Capillary 195 (*)    All other components within normal limits  SARS CORONAVIRUS 2 BY RT PCR  POC URINE PREG, ED    EKG  Radiology No results found.  Procedures Procedures   Medications Ordered in UC Medications - No data to display  Initial Impression / Assessment and Plan / UC Course  I have reviewed the triage vital signs and the nursing notes.  Pertinent labs & imaging results that were available during my care of the patient were reviewed by me and considered in my medical decision making (see chart for details).  She is well appearing in no acute distress. Physical exam reassuring.  Urine pregnancy negative UA unremarkable, denies symptoms CBG 195, fasting COVID test pending.  12:30 PM: Pending labs CBC and CMP, patient would like to wait for results.  1:30 PM: Hgb 11.9, CMP overall unremarkable   Recommend follow up with PCP regarding symptoms, prediabetes Continue iron supp, increase fluids intake.  Return precautions discussed. Patient agrees to plan  Final Clinical Impressions(s) / UC Diagnoses   Final diagnoses:  Nausea  Dizziness of unknown cause  History of iron deficiency anemia  Encounter for screening for COVID-19     Discharge Instructions      Your blood work was normal. Your hemoglobin today was 11.9, this is improved from your previous result. I recommend continue your iron supplement.  We will call you if your covid test returns positive. Please drink lots of water over the next few days.  I recommend following up with  your primary care provider regarding your symptoms and your prediabetes.     ED Prescriptions   None    PDMP not reviewed this encounter.   Abrea Henle, Diana Blankenship 10/28/21 1331

## 2021-10-28 NOTE — ED Notes (Signed)
Covid swab obtained, labeled at bedside verifying identity with 2 identifiers and placed in lab

## 2022-02-06 ENCOUNTER — Encounter (HOSPITAL_COMMUNITY): Payer: Self-pay | Admitting: Emergency Medicine

## 2022-02-06 ENCOUNTER — Ambulatory Visit (HOSPITAL_COMMUNITY)
Admission: EM | Admit: 2022-02-06 | Discharge: 2022-02-06 | Disposition: A | Discharge status: Home / Self Care | Payer: 59 | Attending: Emergency Medicine | Admitting: Emergency Medicine

## 2022-02-06 DIAGNOSIS — J4531 Mild persistent asthma with (acute) exacerbation: Secondary | ICD-10-CM | POA: Diagnosis not present

## 2022-02-06 MED ORDER — BENZONATATE 100 MG PO CAPS: 100.0000 mg | ORAL_CAPSULE | Freq: Three times a day (TID) | ORAL | 0 refills | Status: AC

## 2022-02-06 MED ORDER — IPRATROPIUM-ALBUTEROL 0.5-2.5 (3) MG/3ML IN SOLN
RESPIRATORY_TRACT | Status: AC
  Filled 2022-02-06: qty 3

## 2022-02-06 MED ORDER — PREDNISONE 20 MG PO TABS: 40.0000 mg | ORAL_TABLET | Freq: Every day | ORAL | 0 refills | Status: AC

## 2022-02-06 MED ORDER — IPRATROPIUM-ALBUTEROL 0.5-2.5 (3) MG/3ML IN SOLN
3.0000 mL | Freq: Once | RESPIRATORY_TRACT | Status: AC
  Administered 2022-02-06: 3 mL via RESPIRATORY_TRACT

## 2022-02-06 NOTE — ED Triage Notes (Signed)
Pt reports a cough, shortness of breath and wheezing x 4 days. States last used inhaler 3 hrs ago. Hx of Asthma. Requesting a breathing tx

## 2022-02-06 NOTE — Discharge Instructions (Addendum)
Today you are being treated for a flare of your asthma  Take prednisone every morning with food for 5 days, this medicine will reduce inflammation and irritation to your airways  Use Tessalon pill every 8 hours as needed to help calm your coughing  You have been given a breathing treatment here in our office  On exam your lungs are clear and you are getting enough air without assistance low suspicion for pneumonia at this time   Please continue use of your albuterol inhaler as directed  Any point if your symptoms worsen please go to the hospital

## 2022-02-06 NOTE — ED Provider Notes (Signed)
[facility]    CSN: [id number] Arrival date & time: 02/06/22  1448      History   Chief Complaint Chief Complaint  Patient presents with   Shortness of Breath   Cough   Wheezing    HPI Diana Blankenship is a 25 y.o. female.   Patient presents with right-sided ear pain, productive cough, shortness of breath at rest and wheezing beginning 4 days ago.  Has been using albuterol inhaler every 4 hours, has been minimally helpful.  No known sick contacts.  Tolerating food and liquids.  Denies fever, chills, body aches, nasal congestion and sore throat.  History of asthma.  Past Medical History:  Diagnosis Date   Anemia    Asthma    Eczema    Hidradenitis     Patient Active Problem List   Diagnosis Date Noted   Extrinsic asthma 04/14/2015   Hidradenitis suppurativa 08/04/2012    History reviewed. No pertinent surgical history.  OB History   No obstetric history on file.      Home Medications    Prior to Admission medications   Medication Sig Start Date End Date Taking? Authorizing Provider  albuterol (PROVENTIL HFA;VENTOLIN HFA) 108 (90 Base) MCG/ACT inhaler Inhale 2 puffs into the lungs every 4 (four) hours as needed for wheezing or shortness of breath (cough, shortness of breath or wheezing.). Patient not taking: Reported on 06/26/2019 05/24/17   Doristine Bosworth, MD  doxycycline (VIBRA-TABS) 100 MG tablet Take 1 tablet (100 mg total) by mouth 2 (two) times daily. 06/26/19   Janeece Agee, NP    Family History Family History  Problem Relation Age of Onset   Diabetes Mother    Hyperlipidemia Mother    Hypertension Father     Social History Social History   Tobacco Use   Smoking status: Never   Smokeless tobacco: Never  Substance Use Topics   Alcohol use: No   Drug use: No     Allergies   Patient has no known allergies.   Review of Systems Review of Systems  Constitutional: Negative.   HENT:  Positive for ear pain. Negative for  congestion, dental problem, drooling, ear discharge, facial swelling, hearing loss, mouth sores, nosebleeds, postnasal drip, rhinorrhea, sinus pressure, sinus pain, sneezing, sore throat, tinnitus, trouble swallowing and voice change.   Respiratory:  Positive for cough, shortness of breath and wheezing. Negative for apnea, choking, chest tightness and stridor.   Cardiovascular: Negative.   Gastrointestinal: Negative.   Skin: Negative.   Neurological: Negative.      Physical Exam Triage Vital Signs ED Triage Vitals  Enc Vitals Group     BP 02/06/22 1616 (!) 145/86     Pulse Rate 02/06/22 1616 95     Resp 02/06/22 1616 18     Temp 02/06/22 1616 99 F (37.2 C)     Temp Source 02/06/22 1616 Oral     SpO2 02/06/22 1616 99 %     Weight --      Height --      Head Circumference --      Peak Flow --      Pain Score 02/06/22 1617 0     Pain Loc --      Pain Edu? --      Excl. in GC? --    No data found.  Updated Vital Signs BP (!) 145/86 (BP Location: Right Arm)   Pulse 95   Temp 99 F (37.2 C) (Oral)  Resp 18   SpO2 99%   Visual Acuity Right Eye Distance:   Left Eye Distance:   Bilateral Distance:    Right Eye Near:   Left Eye Near:    Bilateral Near:     Physical Exam Constitutional:      Appearance: Normal appearance.  HENT:     Head: Normocephalic.     Right Ear: Tympanic membrane, ear canal and external ear normal.     Left Ear: Tympanic membrane, ear canal and external ear normal.     Nose: No congestion or rhinorrhea.     Mouth/Throat:     Mouth: Mucous membranes are moist.     Pharynx: No posterior oropharyngeal erythema.  Cardiovascular:     Rate and Rhythm: Normal rate and regular rhythm.     Pulses: Normal pulses.     Heart sounds: Normal heart sounds.  Pulmonary:     Effort: Pulmonary effort is normal.     Breath sounds: Normal breath sounds.  Skin:    General: Skin is warm and dry.  Neurological:     Mental Status: She is alert and oriented to  person, place, and time. Mental status is at baseline.  Psychiatric:        Mood and Affect: Mood normal.        Behavior: Behavior normal.      UC Treatments / Results  Labs (all labs ordered are listed, but only abnormal results are displayed) Labs Reviewed - No data to display  EKG   Radiology No results found.  Procedures Procedures (including critical care time)  Medications Ordered in UC Medications  ipratropium-albuterol (DUONEB) 0.5-2.5 (3) MG/3ML nebulizer solution 3 mL (has no administration in time range)    Initial Impression / Assessment and Plan / UC Course  I have reviewed the triage vital signs and the nursing notes.  Pertinent labs & imaging results that were available during my care of the patient were reviewed by me and considered in my medical decision making (see chart for details).  Mild persistent asthma with acute exacerbation  Vital signs are stable and patient is in no signs of distress nor toxic appearing, lungs are clear to auscultation and O2 saturation at 99% no abnormalities to the bilateral tympanic membranes or ear canal, discussed findings with patient, breathing treatment given in office per patient request, prescribed prednisone and Tessalon for outpatient use and recommended continued use of albuterol inhaler, may follow-up with his urgent care as needed if symptoms persist or worsen Final Clinical Impressions(s) / UC Diagnoses   Final diagnoses:  None   Discharge Instructions   None    ED Prescriptions   None    PDMP not reviewed this encounter.   Valinda Hoar, NP 02/06/22 1657

## 2022-02-08 ENCOUNTER — Other Ambulatory Visit: Payer: Self-pay

## 2022-02-08 ENCOUNTER — Emergency Department (HOSPITAL_BASED_OUTPATIENT_CLINIC_OR_DEPARTMENT_OTHER)
Admission: EM | Admit: 2022-02-08 | Discharge: 2022-02-08 | Disposition: A | Discharge status: Home / Self Care | Payer: 59 | Attending: Emergency Medicine | Admitting: Emergency Medicine

## 2022-02-08 DIAGNOSIS — R059 Cough, unspecified: Secondary | ICD-10-CM | POA: Diagnosis present

## 2022-02-08 DIAGNOSIS — Z7951 Long term (current) use of inhaled steroids: Secondary | ICD-10-CM | POA: Insufficient documentation

## 2022-02-08 DIAGNOSIS — Z7952 Long term (current) use of systemic steroids: Secondary | ICD-10-CM | POA: Insufficient documentation

## 2022-02-08 DIAGNOSIS — J45909 Unspecified asthma, uncomplicated: Secondary | ICD-10-CM | POA: Insufficient documentation

## 2022-02-08 DIAGNOSIS — J069 Acute upper respiratory infection, unspecified: Secondary | ICD-10-CM | POA: Diagnosis not present

## 2022-02-08 DIAGNOSIS — Z20822 Contact with and (suspected) exposure to covid-19: Secondary | ICD-10-CM | POA: Insufficient documentation

## 2022-02-08 LAB — RESP PANEL BY RT-PCR (RSV, FLU A&B, COVID)  RVPGX2
Influenza A by PCR: NEGATIVE
Influenza B by PCR: NEGATIVE
Resp Syncytial Virus by PCR: NEGATIVE
SARS Coronavirus 2 by RT PCR: NEGATIVE

## 2022-02-08 MED ORDER — IPRATROPIUM-ALBUTEROL 0.5-2.5 (3) MG/3ML IN SOLN
RESPIRATORY_TRACT | Status: AC
  Administered 2022-02-08: 3 mL
  Filled 2022-02-08: qty 3

## 2022-02-08 MED ORDER — GUAIFENESIN 100 MG/5ML PO LIQD
10.0000 mL | Freq: Once | ORAL | Status: AC
  Administered 2022-02-08: 10 mL via ORAL
  Filled 2022-02-08: qty 10

## 2022-02-08 MED ORDER — IPRATROPIUM-ALBUTEROL 0.5-2.5 (3) MG/3ML IN SOLN
3.0000 mL | Freq: Once | RESPIRATORY_TRACT | Status: AC
  Administered 2022-02-08: 3 mL via RESPIRATORY_TRACT

## 2022-02-08 MED ORDER — IPRATROPIUM-ALBUTEROL 0.5-2.5 (3) MG/3ML IN SOLN
3.0000 mL | Freq: Once | RESPIRATORY_TRACT | Status: DC
  Filled 2022-02-08: qty 3

## 2022-02-08 NOTE — Discharge Instructions (Signed)
You came to the emergency department today with a cough, chest tightness and shortness of breath.  He did not have any risk factors for pulmonary embolus however as we discussed I would like you to return with any worsening symptoms, leg swelling, coughing up blood or further concerns.  Otherwise you may follow-up with your PCP later this week or early next week.  Continue to take the prednisone, benzonatate and use your inhaler as needed.  It was a pleasure to meet you and we hope you feel better!

## 2022-02-08 NOTE — ED Provider Notes (Signed)
[facility] EMERGENCY DEPT Provider Note   CSN: [id number] Arrival date & time: 02/08/22  8101     History  Chief Complaint  Patient presents with   Cough    Ritta COPELAND LAPIER is a 26 y.o. female with a past medical history of asthma presenting today with a cough occasionally productive of sputum, sore throat and overall discomfort for the past week.  She was seen at urgent care a couple days ago and given a DuoNeb and prednisone and benzonatate.  She says that she feels like the Tessalon makes her feel better for an hour but then her coughing comes back.  Says that she is unable to sleep.  No history of PE, recent travel or surgery, OCPs, tobacco use or hemoptysis   Cough      Home Medications Prior to Admission medications   Medication Sig Start Date End Date Taking? Authorizing Provider  albuterol (PROVENTIL HFA;VENTOLIN HFA) 108 (90 Base) MCG/ACT inhaler Inhale 2 puffs into the lungs every 4 (four) hours as needed for wheezing or shortness of breath (cough, shortness of breath or wheezing.). Patient not taking: Reported on 06/26/2019 05/24/17   Doristine Bosworth, MD  benzonatate (TESSALON) 100 MG capsule Take 1 capsule (100 mg total) by mouth every 8 (eight) hours. 02/06/22   White, Elita Boone, NP  doxycycline (VIBRA-TABS) 100 MG tablet Take 1 tablet (100 mg total) by mouth 2 (two) times daily. 06/26/19   Janeece Agee, NP  predniSONE (DELTASONE) 20 MG tablet Take 2 tablets (40 mg total) by mouth daily. 02/06/22   Valinda Hoar, NP      Allergies    Patient has no known allergies.    Review of Systems   Review of Systems  Respiratory:  Positive for cough.     Physical Exam Updated Vital Signs Pulse 85   Temp 98.5 F (36.9 C) (Oral)   Resp 16   Ht 5\' 4"  (1.626 m)   Wt 102.1 kg   LMP 01/12/2022 (Approximate)   SpO2 100%   BMI 38.62 kg/m  Physical Exam Vitals and nursing note reviewed.  Constitutional:      Appearance: Normal appearance.   HENT:     Head: Normocephalic and atraumatic.  Eyes:     General: No scleral icterus.    Conjunctiva/sclera: Conjunctivae normal.  Cardiovascular:     Rate and Rhythm: Normal rate and regular rhythm.  Pulmonary:     Effort: Pulmonary effort is normal. No respiratory distress.     Breath sounds: No wheezing or rales.  Skin:    Findings: No rash.  Neurological:     Mental Status: She is alert.  Psychiatric:        Mood and Affect: Mood normal.     ED Results / Procedures / Treatments   Labs (all labs ordered are listed, but only abnormal results are displayed) Labs Reviewed  RESP PANEL BY RT-PCR (RSV, FLU A&B, COVID)  RVPGX2    EKG None  Radiology No results found.  Procedures Procedures    Medications Ordered in ED Medications  ipratropium-albuterol (DUONEB) 0.5-2.5 (3) MG/3ML nebulizer solution (  Not Given 02/08/22 0840)  ipratropium-albuterol (DUONEB) 0.5-2.5 (3) MG/3ML nebulizer solution 3 mL (3 mLs Nebulization Given 02/08/22 0837)  guaiFENesin (ROBITUSSIN) 100 MG/5ML liquid 10 mL (10 mLs Oral Given 02/08/22 02/10/22)    ED Course/ Medical Decision Making/ A&P  Medical Decision Making Risk OTC drugs. Prescription drug management.   26 year old female presenting with viral symptoms.  Differential includes viral illness, bacterial illness, PE, pneumonia.  Physical exam: Well-appearing.  Lung sounds clear however patient was given DuoNeb prior to my arrival  Treatment: DuoNeb x 2, Robitussin  MDM/disposition: 26 year old female with viral symptoms.  Negative for COVID and flu.  Suspect that virus has flared up her asthma.  She is already on prednisone from urgent care and has been given Tessalon.  They report that they have tried Delsym over-the-counter but have not tried Robitussin or Mucinex.  I suggested that they try these things.  We did discuss signs and symptoms of DVT/PE and she was given strict return precautions.  Currently  she is PERC negative and will be discharged home at this time.  She and her mother are agreeable to this plan   Final Clinical Impression(s) / ED Diagnoses Final diagnoses:  Viral URI with cough    Rx / DC Orders ED Discharge Orders     None      Results and diagnoses were explained to the patient. Return precautions discussed in full. Patient had no additional questions and expressed complete understanding.   This chart was dictated using voice recognition software.  Despite best efforts to proofread,  errors can occur which can change the documentation meaning.    Woodroe Chen 02/08/22 6314    Alvira Monday, MD 02/08/22 2356

## 2022-02-08 NOTE — ED Triage Notes (Signed)
Pt arrived POV. Pt caox4 and ambulatory. Pt c/o productive cough, sore throat, increased SOB, and CP x1 week. Pt was seen at UC 2 days ago for same. Pt has hx of asthma and advised she used her inhaler once this morning with minimal relief.
